# Patient Record
Sex: Male | Born: 1950 | Race: White | Hispanic: No | Marital: Married | Smoking: Never smoker
Health system: Southern US, Community
[De-identification: ages and names within clinical notes are randomized; demographics above are authoritative.]

## PROBLEM LIST (undated history)

## (undated) DIAGNOSIS — E119 Type 2 diabetes mellitus without complications: Secondary | ICD-10-CM

## (undated) HISTORY — PX: APPENDECTOMY: SHX54

---

## 2005-03-18 ENCOUNTER — Emergency Department (HOSPITAL_COMMUNITY): Admission: EM | Admit: 2005-03-18 | Discharge: 2005-03-18 | Payer: Self-pay | Admitting: *Deleted

## 2014-07-23 ENCOUNTER — Emergency Department (HOSPITAL_COMMUNITY): Payer: Medicaid Other

## 2014-07-23 ENCOUNTER — Emergency Department (HOSPITAL_COMMUNITY)
Admission: EM | Admit: 2014-07-23 | Discharge: 2014-07-23 | Disposition: A | Discharge status: Home / Self Care | Payer: Medicaid Other | Attending: Emergency Medicine | Admitting: Emergency Medicine

## 2014-07-23 ENCOUNTER — Encounter (HOSPITAL_COMMUNITY): Payer: Self-pay | Admitting: Emergency Medicine

## 2014-07-23 DIAGNOSIS — K8000 Calculus of gallbladder with acute cholecystitis without obstruction: Secondary | ICD-10-CM | POA: Diagnosis not present

## 2014-07-23 DIAGNOSIS — Z792 Long term (current) use of antibiotics: Secondary | ICD-10-CM | POA: Insufficient documentation

## 2014-07-23 DIAGNOSIS — E119 Type 2 diabetes mellitus without complications: Secondary | ICD-10-CM | POA: Insufficient documentation

## 2014-07-23 DIAGNOSIS — R109 Unspecified abdominal pain: Secondary | ICD-10-CM | POA: Insufficient documentation

## 2014-07-23 HISTORY — DX: Type 2 diabetes mellitus without complications: E11.9

## 2014-07-23 LAB — URINALYSIS, ROUTINE W REFLEX MICROSCOPIC
BILIRUBIN URINE: NEGATIVE
GLUCOSE, UA: NEGATIVE mg/dL
HGB URINE DIPSTICK: NEGATIVE
KETONES UR: 15 mg/dL — AB
Leukocytes, UA: NEGATIVE
Nitrite: NEGATIVE
PROTEIN: NEGATIVE mg/dL
Specific Gravity, Urine: 1.01 (ref 1.005–1.030)
Urobilinogen, UA: 0.2 mg/dL (ref 0.0–1.0)
pH: 8 (ref 5.0–8.0)

## 2014-07-23 LAB — COMPREHENSIVE METABOLIC PANEL
ALBUMIN: 4.9 g/dL (ref 3.5–5.2)
ALT: 15 U/L (ref 0–53)
ANION GAP: 18 — AB (ref 5–15)
AST: 24 U/L (ref 0–37)
Alkaline Phosphatase: 77 U/L (ref 39–117)
BILIRUBIN TOTAL: 0.9 mg/dL (ref 0.3–1.2)
BUN: 7 mg/dL (ref 6–23)
CHLORIDE: 95 meq/L — AB (ref 96–112)
CO2: 22 meq/L (ref 19–32)
CREATININE: 0.82 mg/dL (ref 0.50–1.35)
Calcium: 10 mg/dL (ref 8.4–10.5)
GFR calc Af Amer: >90 mL/min (ref >90)
GFR calc non Af Amer: >90 mL/min (ref >90)
Glucose, Bld: 145 mg/dL — ABNORMAL HIGH (ref 70–99)
POTASSIUM: 4.2 meq/L (ref 3.7–5.3)
SODIUM: 135 meq/L — AB (ref 137–147)
TOTAL PROTEIN: 8.5 g/dL — AB (ref 6.0–8.3)

## 2014-07-23 LAB — CBC WITH DIFFERENTIAL/PLATELET
BASOS PCT: 0 % (ref 0–1)
Basophils Absolute: 0 10*3/uL (ref 0.0–0.1)
EOS ABS: 0 10*3/uL (ref 0.0–0.7)
Eosinophils Relative: 0 % (ref 0–5)
HEMATOCRIT: 42.7 % (ref 39.0–52.0)
HEMOGLOBIN: 15.7 g/dL (ref 13.0–17.0)
LYMPHS ABS: 0.4 10*3/uL — AB (ref 0.7–4.0)
LYMPHS PCT: 4 % — AB (ref 12–46)
MCH: 32.2 pg (ref 26.0–34.0)
MCHC: 36.8 g/dL — ABNORMAL HIGH (ref 30.0–36.0)
MCV: 87.5 fL (ref 78.0–100.0)
MONO ABS: 0.5 10*3/uL (ref 0.1–1.0)
Monocytes Relative: 4 % (ref 3–12)
NEUTROS PCT: 93 % — AB (ref 43–77)
Neutro Abs: 11.3 10*3/uL — ABNORMAL HIGH (ref 1.7–7.7)
PLATELETS: 186 10*3/uL (ref 150–400)
RBC: 4.88 MIL/uL (ref 4.22–5.81)
RDW: 12.1 % (ref 11.5–15.5)
WBC: 12.2 10*3/uL — AB (ref 4.0–10.5)

## 2014-07-23 LAB — CBG MONITORING, ED: Glucose-Capillary: 140 mg/dL — ABNORMAL HIGH (ref 70–99)

## 2014-07-23 LAB — LIPASE, BLOOD: Lipase: 33 U/L (ref 11–59)

## 2014-07-23 MED ORDER — HYDROMORPHONE HCL PF 1 MG/ML IJ SOLN
0.5000 mg | Freq: Once | INTRAMUSCULAR | Status: AC
  Administered 2014-07-23: 0.5 mg via INTRAVENOUS
  Filled 2014-07-23: qty 1

## 2014-07-23 MED ORDER — PROMETHAZINE HCL 25 MG/ML IJ SOLN
12.5000 mg | Freq: Once | INTRAMUSCULAR | Status: AC
  Administered 2014-07-23: 12.5 mg via INTRAVENOUS
  Filled 2014-07-23: qty 1

## 2014-07-23 MED ORDER — ONDANSETRON HCL 4 MG/2ML IJ SOLN
INTRAMUSCULAR | Status: AC
  Administered 2014-07-23: 4 mg via INTRAVENOUS
  Filled 2014-07-23: qty 2

## 2014-07-23 MED ORDER — CIPROFLOXACIN HCL 500 MG PO TABS: 500.0000 mg | ORAL_TABLET | Freq: Two times a day (BID) | ORAL | Status: AC

## 2014-07-23 MED ORDER — CIPROFLOXACIN HCL 250 MG PO TABS
500.0000 mg | ORAL_TABLET | Freq: Once | ORAL | Status: AC
  Administered 2014-07-23: 500 mg via ORAL
  Filled 2014-07-23: qty 2

## 2014-07-23 MED ORDER — PROMETHAZINE HCL 25 MG PO TABS: 25.0000 mg | ORAL_TABLET | Freq: Four times a day (QID) | ORAL | Status: AC | PRN

## 2014-07-23 MED ORDER — OXYCODONE-ACETAMINOPHEN 5-325 MG PO TABS: 1.0000 | ORAL_TABLET | ORAL | Status: AC | PRN

## 2014-07-23 MED ORDER — ONDANSETRON HCL 4 MG/2ML IJ SOLN
4.0000 mg | Freq: Once | INTRAMUSCULAR | Status: AC
  Administered 2014-07-23: 4 mg via INTRAVENOUS

## 2014-07-23 MED ORDER — HYDROMORPHONE HCL PF 1 MG/ML IJ SOLN
INTRAMUSCULAR | Status: AC
  Administered 2014-07-23: 0.5 mg
  Filled 2014-07-23: qty 1

## 2014-07-23 MED ORDER — HYDROMORPHONE HCL PF 1 MG/ML IJ SOLN
1.0000 mg | Freq: Once | INTRAMUSCULAR | Status: AC
  Administered 2014-07-23: 1 mg via INTRAVENOUS
  Filled 2014-07-23: qty 1

## 2014-07-23 NOTE — ED Provider Notes (Signed)
CSN: [id number]     Arrival date & time 07/23/14  1038 History   First MD Initiated Contact with Patient 07/23/14 1055     Chief Complaint  Patient presents with  . Abdominal Pain     (Consider location/radiation/quality/duration/timing/severity/associated sxs/prior Treatment) HPI  Netta CorriganClaudio J Dohn is a 63 y.o. male presenting with sudden onset of very sharp right upper quadrant abdominal pain around 7 AM this morning while he was walking his dog.  He describes a very healthy eating habits, has been very strict about eating only fruits and vegetables with no carbohydrates as he is actively trying to lose weight.  Last night he indulged in potato chips and beer which he believes is the source of today's pain.  He has nausea without vomiting, denies diarrhea or constipation.  He had a normal stool this morning prior to onset of symptoms.  He does endorse a distant past history of EtOH abuse and has had hepatitis in the past, denies history of pancreatitis.  He also has diet controlled diabetes, but is not currently being followed for this as he currently has no primary care physician.  He is taking no medications prior to arrival and has had no by mouth intake today.  He denies chest pain and shortness of breath.  His pain is constant, sharp and does not radiate.  Past Medical History  Diagnosis Date  . Diabetes mellitus without complication    History reviewed. No pertinent past surgical history. History reviewed. No pertinent family history. History  Substance Use Topics  . Smoking status: Never Smoker   . Smokeless tobacco: Not on file  . Alcohol Use: Not on file    Review of Systems  Constitutional: Negative for fever.  HENT: Negative for congestion and sore throat.   Eyes: Negative.   Respiratory: Negative for chest tightness and shortness of breath.   Cardiovascular: Negative for chest pain.  Gastrointestinal: Positive for nausea and abdominal pain. Negative for vomiting.   Genitourinary: Negative.  Negative for dysuria and flank pain.  Musculoskeletal: Negative for arthralgias, joint swelling and neck pain.  Skin: Negative.  Negative for rash and wound.  Neurological: Negative for dizziness, weakness, light-headedness, numbness and headaches.  Psychiatric/Behavioral: Negative.       Allergies  Review of patient's allergies indicates no known allergies.  Home Medications   Prior to Admission medications   Medication Sig Start Date End Date Taking? Authorizing Provider  ciprofloxacin (CIPRO) 500 MG tablet Take 1 tablet (500 mg total) by mouth 2 (two) times daily. 07/23/14   Burgess AmorJulie Georganne Siple, PA-C  oxyCODONE-acetaminophen (PERCOCET/ROXICET) 5-325 MG per tablet Take 1 tablet by mouth every 4 (four) hours as needed. 07/23/14   Burgess AmorJulie Siara Gorder, PA-C  promethazine (PHENERGAN) 25 MG tablet Take 1 tablet (25 mg total) by mouth every 6 (six) hours as needed for nausea or vomiting. 07/23/14   Burgess AmorJulie Miron Marxen, PA-C   BP 164/97  Pulse 91  Temp(Src) 98.2 F (36.8 C) (Oral)  Resp 16  SpO2 100% Physical Exam  Nursing note and vitals reviewed. Constitutional: He appears well-developed and well-nourished.  HENT:  Head: Normocephalic and atraumatic.  Eyes: Conjunctivae are normal.  Neck: Normal range of motion.  Cardiovascular: Normal rate, regular rhythm, normal heart sounds and intact distal pulses.   Pulmonary/Chest: Effort normal and breath sounds normal. He has no wheezes.  Abdominal: Soft. Bowel sounds are normal. There is no hepatosplenomegaly. There is tenderness in the right upper quadrant. There is positive Murphy's sign. There is no  rigidity and no guarding.  Musculoskeletal: Normal range of motion.  Neurological: He is alert.  Skin: Skin is warm and dry.  Psychiatric: He has a normal mood and affect.    ED Course  Procedures (including critical care time) Labs Review Labs Reviewed  CBC WITH DIFFERENTIAL - Abnormal; Notable for the following:    WBC 12.2 (*)     MCHC 36.8 (*)    Neutrophils Relative % 93 (*)    Neutro Abs 11.3 (*)    Lymphocytes Relative 4 (*)    Lymphs Abs 0.4 (*)    All other components within normal limits  COMPREHENSIVE METABOLIC PANEL - Abnormal; Notable for the following:    Sodium 135 (*)    Chloride 95 (*)    Glucose, Bld 145 (*)    Total Protein 8.5 (*)    Anion gap 18 (*)    All other components within normal limits  URINALYSIS, ROUTINE W REFLEX MICROSCOPIC - Abnormal; Notable for the following:    Ketones, ur 15 (*)    All other components within normal limits  CBG MONITORING, ED - Abnormal; Notable for the following:    Glucose-Capillary 140 (*)    All other components within normal limits  LIPASE, BLOOD    Imaging Review US Abdomen Limited  07/23/2014   CLINICAL DATA:  Right upper quadrant abdominal pain with known gallstones  EXAM: US ABDOMEN LIMITED - RIGHT UPPER QUADRANT  COMPARISON:  None.  FINDINGS: Gallbladder:  The gallbladder is adequately distended. The bile is echogenic. There are non-mobile stones near the gallbladder neck. The gallbladder wall is thickened measuring 9 mm. There is no positive sonographic Murphy's sign.  Common bile duct:  Diameter: 4.3 mm  Liver:  The liver exhibits no focal mass or ductal dilation.  IMPRESSION: There are non mobile gallstones as well as sludge and gallbladder wall thickening. There is no positive sonographic Murphy's sign. The findings may reflect chronic cholecystitis.   Electronically Signed   By: David  [place]   On: 07/23/2014 13:21     EKG Interpretation None      MDM   Final diagnoses:  Calculus of gallbladder with acute cholecystitis without obstruction    Patients labs and/or radiological studies were viewed and considered during the medical decision making and disposition process. Patient with gallbladder disease, slightly elevated white blood cell count with a left shift.  He has received IV fluids along with Dilaudid and Zofran with improvement in  symptoms.  Discussed the patient's findings with Dr. Lovell Sheehan who has recommended close outpatient followup.  Patient will call for an office visit tomorrow if he has any residual symptoms, otherwise will plan to see Dr. Lovell Sheehan early next week if he remains symptom free.  In the interim Dr. Lovell Sheehan has asked for him to be placed on Cipro which was prescribed for him, his first dose was given here prior to discharge home.  Patient understands and agrees with plan today.  He was given strict return precautions including worsens pain, uncontrolled vomiting or development of fevers.  He was advised to avoid fatty foods.    Burgess Amor, PA-C 07/23/14 1811

## 2014-07-23 NOTE — ED Notes (Signed)
Pt has stopped moaning since dilaudid given. States better but still hurts. Pt making jokes at times but groggy.

## 2014-07-23 NOTE — Discharge Instructions (Signed)
Cholecystitis Cholecystitis is an inflammation of your gallbladder. It is usually caused by a buildup of gallstones or sludge (cholelithiasis) in your gallbladder. The gallbladder stores a fluid that helps digest fats (bile). Cholecystitis is serious and needs treatment right away.  CAUSES   Gallstones. Gallstones can block the tube that leads to your gallbladder, causing bile to build up. As bile builds up, the gallbladder becomes inflamed.  Bile duct problems, such as blockage from scarring or kinking.  Tumors. Tumors can stop bile from leaving your gallbladder correctly, causing bile to build up. As bile builds up, the gallbladder becomes inflamed. SYMPTOMS   Nausea.  Vomiting.  Abdominal pain, especially in the upper right area of your abdomen.  Abdominal tenderness or bloating.  Sweating.  Chills.  Fever.  Yellowing of the skin and the whites of the eyes (jaundice). DIAGNOSIS  Your caregiver may order blood tests to look for infection or gallbladder problems. Your caregiver may also order imaging tests, such as an ultrasound or computed tomography (CT) scan. Further tests may include a hepatobiliary iminodiacetic acid (HIDA) scan. This scan allows your caregiver to see your bile move from the liver to the gallbladder and to the small intestine. TREATMENT  A hospital stay is usually necessary to lessen the inflammation of your gallbladder. You may be required to not eat or drink (fast) for a certain amount of time. You may be given medicine to treat pain or an antibiotic medicine to treat an infection. Surgery may be needed to remove your gallbladder (cholecystectomy) once the inflammation has gone down. Surgery may be needed right away if you develop complications such as death of gallbladder tissue (gangrene) or a tear (perforation) of the gallbladder.  HOME CARE INSTRUCTIONS  Home care will depend on your treatment. In general:  If you were given antibiotics, take them as  directed. Finish them even if you start to feel better.  Only take over-the-counter or prescription medicines for pain, discomfort, or fever as directed by your caregiver.  Follow a low-fat diet until you see your caregiver again.  Keep all follow-up visits as directed by your caregiver. SEEK IMMEDIATE MEDICAL CARE IF:   Your pain is increasing and not controlled by medicines.  Your pain moves to another part of your abdomen or to your back.  You have a fever.  You have nausea and vomiting. MAKE SURE YOU:  Understand these instructions.  Will watch your condition.  Will get help right away if you are not doing well or get worse. Document Released: 11/28/2005 Document Revised: 02/20/2012 Document Reviewed: 10/14/2011 [facility] Patient Information 2015 [employer] [place]. This information is not intended to replace advice given to you by your health care provider. Make sure you discuss any questions you have with your health care provider.   Use the medicines prescribed for pain and nausea.  Use caution as they will make you sleepy.  Do not drive within 4 hours of taking percocet or phenergan.  Take you next dose of the antibiotic tonight (ciprofloxacin). Avoid eating any fatty foods as this can trigger a gallbladder attack.  Call Dr. Lovell SheehanJenkins for an office visit tomorrow (call before 9:30) if you are still having symptoms.  If your symptoms are gone,  You can wait until early next week to see him.  Get rechecked immediately if you develop worsened pain, uncontrolled vomiting or FEVER.

## 2014-07-23 NOTE — ED Notes (Signed)
Pt constantly moaning, grimacing and slightly restless. Advised edp will be in asap. Pt in fetal position. Alert/oriented. Mm moist to dry. Mild dehydration noted. Fluids NS started to heplock at this time at 7450ml/hr.

## 2014-07-23 NOTE — ED Notes (Signed)
Pain c/o of sharp RUQ pain since 0700 this am worse with ambulation. Pt states he normally eats a carb free diet, however indulged in carbs last night, Hx of DM. Dry heaves.

## 2014-07-23 NOTE — ED Notes (Signed)
Pt states his pain is a 12/10 and going up. Pt medicated

## 2014-07-24 NOTE — ED Provider Notes (Signed)
Medical screening examination/treatment/procedure(s) were performed by non-physician practitioner and as supervising physician I was immediately available for consultation/collaboration.   EKG Interpretation None        Benny LennertJoseph L Traves Majchrzak, MD 07/24/14 [phone]

## 2018-02-19 ENCOUNTER — Emergency Department (HOSPITAL_COMMUNITY)
Admission: EM | Admit: 2018-02-19 | Discharge: 2018-02-19 | Disposition: A | Discharge status: Home / Self Care | Payer: Medicare Other | Attending: Emergency Medicine | Admitting: Emergency Medicine

## 2018-02-19 ENCOUNTER — Emergency Department (HOSPITAL_COMMUNITY): Payer: Medicare Other

## 2018-02-19 ENCOUNTER — Encounter (HOSPITAL_COMMUNITY): Payer: Self-pay

## 2018-02-19 DIAGNOSIS — K8020 Calculus of gallbladder without cholecystitis without obstruction: Secondary | ICD-10-CM | POA: Insufficient documentation

## 2018-02-19 DIAGNOSIS — K2980 Duodenitis without bleeding: Secondary | ICD-10-CM | POA: Insufficient documentation

## 2018-02-19 DIAGNOSIS — R1013 Epigastric pain: Secondary | ICD-10-CM

## 2018-02-19 LAB — CBC WITH DIFFERENTIAL/PLATELET
Basophils Absolute: 0 10*3/uL (ref 0.0–0.1)
Basophils Relative: 0 %
EOS ABS: 0 10*3/uL (ref 0.0–0.7)
Eosinophils Relative: 0 %
HCT: 46.2 % (ref 39.0–52.0)
HEMOGLOBIN: 15.9 g/dL (ref 13.0–17.0)
LYMPHS ABS: 1.8 10*3/uL (ref 0.7–4.0)
Lymphocytes Relative: 19 %
MCH: 31.2 pg (ref 26.0–34.0)
MCHC: 34.4 g/dL (ref 30.0–36.0)
MCV: 90.6 fL (ref 78.0–100.0)
MONOS PCT: 9 %
Monocytes Absolute: 0.8 10*3/uL (ref 0.1–1.0)
NEUTROS PCT: 72 %
Neutro Abs: 6.6 10*3/uL (ref 1.7–7.7)
Platelets: 267 10*3/uL (ref 150–400)
RBC: 5.1 MIL/uL (ref 4.22–5.81)
RDW: 12.8 % (ref 11.5–15.5)
WBC: 9.2 10*3/uL (ref 4.0–10.5)

## 2018-02-19 LAB — URINALYSIS, ROUTINE W REFLEX MICROSCOPIC
Bilirubin Urine: NEGATIVE
GLUCOSE, UA: NEGATIVE mg/dL
Hgb urine dipstick: NEGATIVE
KETONES UR: 20 mg/dL — AB
LEUKOCYTES UA: NEGATIVE
Nitrite: NEGATIVE
PROTEIN: NEGATIVE mg/dL
Specific Gravity, Urine: 1.018 (ref 1.005–1.030)
pH: 6 (ref 5.0–8.0)

## 2018-02-19 LAB — COMPREHENSIVE METABOLIC PANEL
ALK PHOS: 233 U/L — AB (ref 38–126)
ALT: 56 U/L (ref 17–63)
ANION GAP: 15 (ref 5–15)
AST: 38 U/L (ref 15–41)
Albumin: 4.3 g/dL (ref 3.5–5.0)
BUN: 11 mg/dL (ref 6–20)
CALCIUM: 10.2 mg/dL (ref 8.9–10.3)
CO2: 22 mmol/L (ref 22–32)
Chloride: 99 mmol/L — ABNORMAL LOW (ref 101–111)
Creatinine, Ser: 0.85 mg/dL (ref 0.61–1.24)
Glucose, Bld: 137 mg/dL — ABNORMAL HIGH (ref 65–99)
Potassium: 4.1 mmol/L (ref 3.5–5.1)
Sodium: 136 mmol/L (ref 135–145)
Total Bilirubin: 1.2 mg/dL (ref 0.3–1.2)
Total Protein: 8.3 g/dL — ABNORMAL HIGH (ref 6.5–8.1)

## 2018-02-19 LAB — LIPASE, BLOOD: Lipase: 39 U/L (ref 11–51)

## 2018-02-19 LAB — TROPONIN I: Troponin I: <0.03 ng/mL (ref <0.03)

## 2018-02-19 MED ORDER — PANTOPRAZOLE SODIUM 20 MG PO TBEC: 20.0000 mg | DELAYED_RELEASE_TABLET | Freq: Every day | ORAL | 0 refills | Status: AC

## 2018-02-19 MED ORDER — ONDANSETRON HCL 4 MG PO TABS: 4.0000 mg | ORAL_TABLET | Freq: Four times a day (QID) | ORAL | 0 refills | Status: AC | PRN

## 2018-02-19 MED ORDER — SODIUM CHLORIDE 0.9 % IV BOLUS (SEPSIS)
500.0000 mL | Freq: Once | INTRAVENOUS | Status: AC
  Administered 2018-02-19: 500 mL via INTRAVENOUS

## 2018-02-19 MED ORDER — KETOROLAC TROMETHAMINE 30 MG/ML IJ SOLN
30.0000 mg | Freq: Once | INTRAMUSCULAR | Status: AC
  Administered 2018-02-19: 30 mg via INTRAVENOUS
  Filled 2018-02-19: qty 1

## 2018-02-19 MED ORDER — HYDROCODONE-ACETAMINOPHEN 5-325 MG PO TABS: 1.0000 | ORAL_TABLET | ORAL | 0 refills | Status: AC | PRN

## 2018-02-19 MED ORDER — MORPHINE SULFATE (PF) 4 MG/ML IV SOLN
4.0000 mg | Freq: Once | INTRAVENOUS | Status: AC
  Administered 2018-02-19: 4 mg via INTRAVENOUS
  Filled 2018-02-19: qty 1

## 2018-02-19 MED ORDER — ONDANSETRON HCL 4 MG/2ML IJ SOLN
4.0000 mg | Freq: Once | INTRAMUSCULAR | Status: AC
  Administered 2018-02-19: 4 mg via INTRAVENOUS
  Filled 2018-02-19: qty 2

## 2018-02-19 MED ORDER — GI COCKTAIL ~~LOC~~
30.0000 mL | Freq: Once | ORAL | Status: AC
  Administered 2018-02-19: 30 mL via ORAL
  Filled 2018-02-19: qty 30

## 2018-02-19 NOTE — ED Triage Notes (Signed)
Pt reports chest pain, abd pain, fever, and vomiting since Friday.  Reports had similar symptoms 4 years and it was from his gall bladder.

## 2018-02-19 NOTE — ED Provider Notes (Signed)
[facility] EMERGENCY DEPARTMENT Provider Note   CSN: [id number] Arrival date & time: 02/19/18  4098     History   Chief Complaint Chief Complaint  Patient presents with  . Abdominal Pain  . Fever    HPI Roger Lyons is a 67 y.o. male.  HPI Patient presents with acute onset left upper quadrant abdominal pain radiating to the left flank starting 2 days ago.  Associated with nausea and "dry heaving".  He has had subjective fevers and chills.  States he does not had a bowel movement in 2 days.  Denies dysuria or gross hematuria but states his urine has been dark. Past Medical History:  Diagnosis Date  . Diabetes mellitus without complication (HCC)     There are no active problems to display for this patient.   Past Surgical History:  Procedure Laterality Date  . APPENDECTOMY         Home Medications    Prior to Admission medications   Medication Sig Start Date End Date Taking? Authorizing Provider  ciprofloxacin (CIPRO) 500 MG tablet Take 1 tablet (500 mg total) by mouth 2 (two) times daily. Patient not taking: Reported on 02/19/2018 07/23/14   Burgess Amor, PA-C  HYDROcodone-acetaminophen (NORCO) 5-325 MG tablet Take 1 tablet by mouth every 4 (four) hours as needed for severe pain. 02/19/18   Loren Racer, MD  ondansetron (ZOFRAN) 4 MG tablet Take 1 tablet (4 mg total) by mouth every 6 (six) hours as needed for nausea or vomiting. 02/19/18   Loren Racer, MD  oxyCODONE-acetaminophen (PERCOCET/ROXICET) 5-325 MG per tablet Take 1 tablet by mouth every 4 (four) hours as needed. Patient not taking: Reported on 02/19/2018 07/23/14   Burgess Amor, PA-C  pantoprazole (PROTONIX) 20 MG tablet Take 1 tablet (20 mg total) by mouth daily. 02/19/18   Loren Racer, MD  promethazine (PHENERGAN) 25 MG tablet Take 1 tablet (25 mg total) by mouth every 6 (six) hours as needed for nausea or vomiting. Patient not taking: Reported on 02/19/2018 07/23/14   Burgess Amor, PA-C     Family History No family history on file.  Social History Social History   Tobacco Use  . Smoking status: Never Smoker  . Smokeless tobacco: Never Used  Substance Use Topics  . Alcohol use: No    Frequency: Never  . Drug use: No     Allergies   Patient has no known allergies.   Review of Systems Review of Systems  Constitutional: Negative for chills and fever.  HENT: Negative for sore throat and trouble swallowing.   Respiratory: Negative for cough and shortness of breath.   Cardiovascular: Negative for chest pain.  Gastrointestinal: Positive for abdominal pain, constipation, nausea and vomiting. Negative for blood in stool.  Genitourinary: Positive for flank pain. Negative for dysuria, frequency and hematuria.  Musculoskeletal: Positive for back pain. Negative for neck pain.  Skin: Negative for rash and wound.  Neurological: Negative for dizziness, syncope, weakness, numbness and headaches.  All other systems reviewed and are negative.    Physical Exam Updated Vital Signs BP (!) 141/87   Pulse 60   Temp 98.5 F (36.9 C) (Oral)   Resp 20   Ht 5\' 7"  (1.702 m)   Wt 65.8 kg (145 lb)   SpO2 92%   BMI 22.71 kg/m   Physical Exam  Constitutional: He is oriented to person, place, and time. He appears well-developed and well-nourished. He appears distressed.  HENT:  Head: Normocephalic and atraumatic.  Mouth/Throat: Oropharynx  is clear and moist. No oropharyngeal exudate.  Eyes: EOM are normal. Pupils are equal, round, and reactive to light.  Neck: Normal range of motion. Neck supple.  Cardiovascular: Normal rate and regular rhythm. Exam reveals no gallop and no friction rub.  No murmur heard. Pulmonary/Chest: Effort normal and breath sounds normal. No stridor. No respiratory distress. He has no wheezes. He has no rales. He exhibits no tenderness.  Abdominal: Soft. Bowel sounds are normal. He exhibits no distension. There is no tenderness. There is no rebound  and no guarding.  Benign abdominal exam.  No tenderness with palpation.  Musculoskeletal: Normal range of motion. He exhibits no edema or tenderness.  Left CVA tenderness with percussion.  No midline thoracic or lumbar tenderness.  No lower extremity swelling, asymmetry or tenderness.  Neurological: He is alert and oriented to person, place, and time.  Moves all extremities without focal deficit.  Sensation fully intact.  Skin: Skin is warm and dry. Capillary refill takes less than 2 seconds. No rash noted. No erythema.  Psychiatric: He has a normal mood and affect. His behavior is normal.  Nursing note and vitals reviewed.    ED Treatments / Results  Labs (all labs ordered are listed, but only abnormal results are displayed) Labs Reviewed  COMPREHENSIVE METABOLIC PANEL - Abnormal; Notable for the following components:      Result Value   Chloride 99 (*)    Glucose, Bld 137 (*)    Total Protein 8.3 (*)    Alkaline Phosphatase 233 (*)    All other components within normal limits  URINALYSIS, ROUTINE W REFLEX MICROSCOPIC - Abnormal; Notable for the following components:   APPearance HAZY (*)    Ketones, ur 20 (*)    All other components within normal limits  CBC WITH DIFFERENTIAL/PLATELET  LIPASE, BLOOD  TROPONIN I    EKG  EKG Interpretation  Date/Time:  Monday February 19 2018 09:39:55 EDT Ventricular Rate:  71 PR Interval:    QRS Duration: 95 QT Interval:  393 QTC Calculation: 433 R Axis:   74 Text Interpretation:  Sinus rhythm Baseline wander in lead(s) V4 Confirmed by Loren Racer ([id number]) on 02/19/2018 9:56:26 AM       Radiology Ct Renal Stone Study  Result Date: 02/19/2018 CLINICAL DATA:  Upper abdominal pain for 4 days EXAM: CT ABDOMEN AND PELVIS WITHOUT CONTRAST TECHNIQUE: Multidetector CT imaging of the abdomen and pelvis was performed following the standard protocol without IV contrast. COMPARISON:  03/18/2005 FINDINGS: Lower chest: Lung bases are clear. No  effusions. Heart is normal size. Hepatobiliary: Gallstone noted within a contracted gallbladder measures up to 2.4 cm. Mild intrahepatic and extrahepatic biliary ductal dilatation. Common bile duct measures up to 8 mm. No visible stones in the common bile duct. No focal hepatic abnormality. Pancreas: No focal abnormality or ductal dilatation. Spleen: No focal abnormality.  Normal size. Adrenals/Urinary Tract: Small exophytic nodule off the upper pole of the right kidney measures 1.5 cm in cannot be fully characterized without intravenous contrast. This was present on prior study and measured 10 mm. Therefore, I favor this represents a benign cyst. New low-density lesion off the lower pole of the right kidney measures up to 2.4 cm. Exophytic low-density lesion off the midpole of the left kidney measures 10 mm. No hydronephrosis. No adrenal mass. Urinary bladder unremarkable. Stomach/Bowel: Left colonic diverticulosis and right colonic diverticulosis. No active diverticulitis. No evidence of bowel obstruction. There is some indistinctness and possible wall thickening within the 1st  and 2nd portions of the duodenum. Surrounding haziness, appears more centered around the duodenum than the pancreatic head. Findings may reflect duodenitis. Vascular/Lymphatic: Aortic atherosclerosis. No enlarged abdominal or pelvic lymph nodes. Reproductive: Mild prostate prominence. Other: Trace free fluid in the pelvis.  No free air. Musculoskeletal: No acute bony abnormality. IMPRESSION: Cholelithiasis. Slight intrahepatic and extrahepatic biliary ductal dilatation without visible stones. There is apparent mild wall thickening in the 1st and 2nd portions of the duodenum with surrounding stranding raising the possibility for duodenitis. Bilateral low-density lesions in the kidneys which cannot be fully characterized without intravenous contrast. These could be fully characterized with contrast-enhanced CT or ultrasound. Colonic  diverticulosis.  No active diverticulitis. Electronically Signed   By: Charlett NoseKevin  Dover M.D.   On: 02/19/2018 10:40    Procedures Procedures (including critical care time)  Medications Ordered in ED Medications  morphine 4 MG/ML injection 4 mg (4 mg Intravenous Given 02/19/18 0951)  ketorolac (TORADOL) 30 MG/ML injection 30 mg (30 mg Intravenous Given 02/19/18 0951)  sodium chloride 0.9 % bolus 500 mL (0 mLs Intravenous Stopped 02/19/18 1121)  ondansetron (ZOFRAN) injection 4 mg (4 mg Intravenous Given 02/19/18 0951)  morphine 4 MG/ML injection 4 mg (4 mg Intravenous Given 02/19/18 1315)  gi cocktail (Maalox,Lidocaine,Donnatal) (30 mLs Oral Given 02/19/18 1315)     Initial Impression / Assessment and Plan / ED Course  I have reviewed the triage vital signs and the nursing notes.  Pertinent labs & imaging results that were available during my care of the patient were reviewed by me and considered in my medical decision making (see chart for details).     Repeat abdominal exam is benign. pain is controlled.  CT with likely duodenitis.  Patient does have evidence of cholelithiasis.  Low suspicion for cholecystitis.  Will start on PPI, pain medication, nausea medication have follow-up with gastroenterology.  Return precautions have been given.  Final Clinical Impressions(s) / ED Diagnoses   Final diagnoses:  Epigastric pain  Duodenitis  Calculus of gallbladder without cholecystitis without obstruction    ED Discharge Orders        Ordered    pantoprazole (PROTONIX) 20 MG tablet  Daily     02/19/18 1342    ondansetron (ZOFRAN) 4 MG tablet  Every 6 hours PRN     02/19/18 1342    HYDROcodone-acetaminophen (NORCO) 5-325 MG tablet  Every 4 hours PRN     02/19/18 1342       Loren RacerYelverton, Janeya Deyo, MD 02/19/18 1344

## 2018-02-19 NOTE — ED Notes (Signed)
Pt unable to urinate at this time.  States his pain is starting to come back.

## 2019-04-24 IMAGING — CT CT RENAL STONE PROTOCOL
2 of 4 series · 15 of 46 positions shown, 17 images · non-contrast
Comparison: 03/18/2005

CLINICAL DATA: Upper abdominal pain for 4 days

EXAM:
CT ABDOMEN AND PELVIS WITHOUT CONTRAST
TECHNIQUE: Multidetector CT imaging of the abdomen and pelvis was performed
following the standard protocol without IV contrast.

[Series 2: axial st · axial · 0.72mm/px · z∈[+1504,+1929]mm · 12 of 93 slices shown, 14 images]
[im 4/93  soft-tissue]
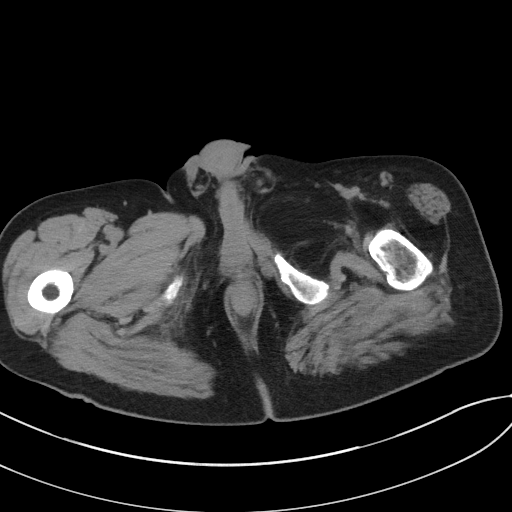
[im 4/93  bone]
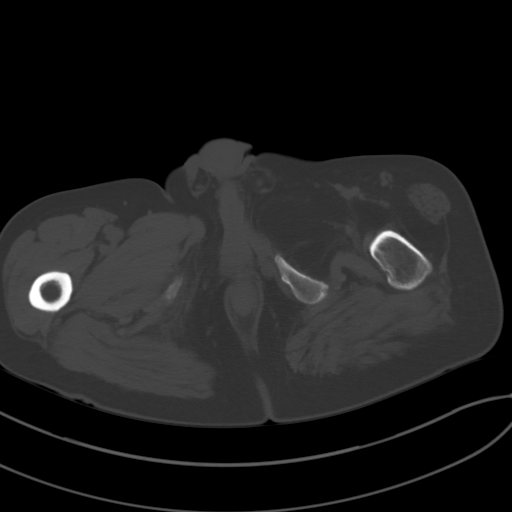
[im 12/93  soft-tissue]
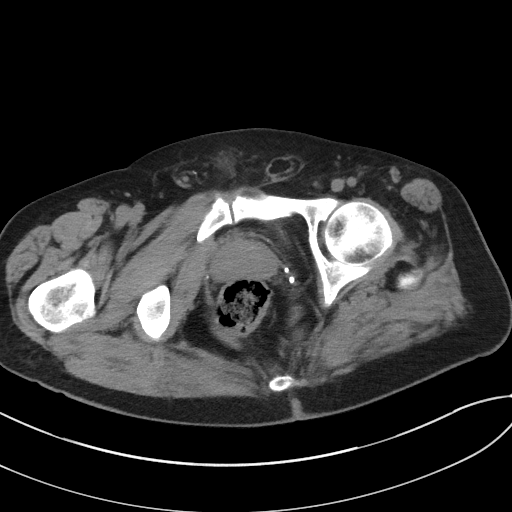
[im 20/93  soft-tissue]
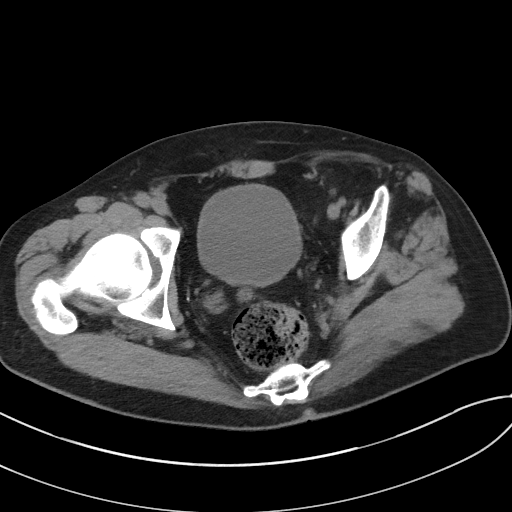
[im 27/93  soft-tissue]
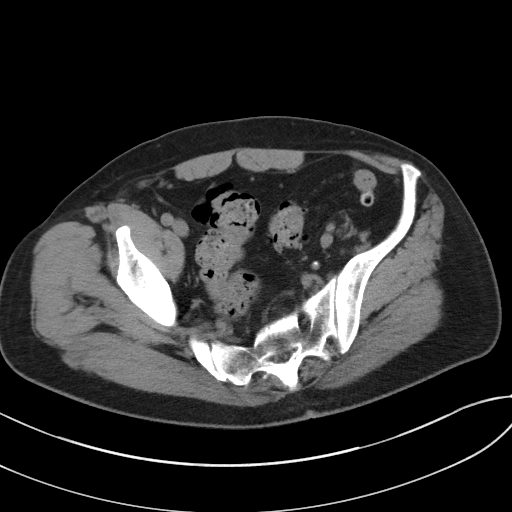
[im 35/93  soft-tissue]
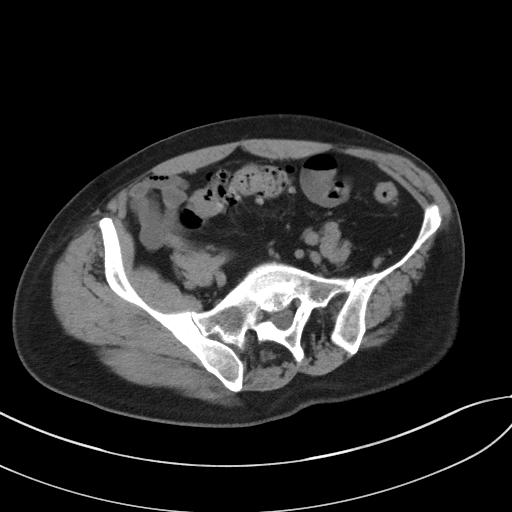
[im 43/93  soft-tissue]
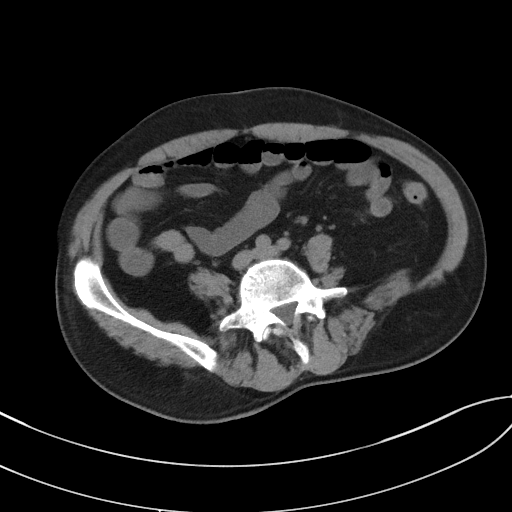
[im 50/93  soft-tissue]
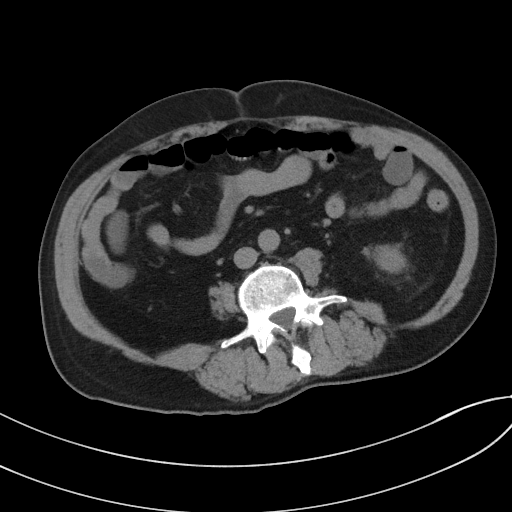
[im 58/93  soft-tissue]
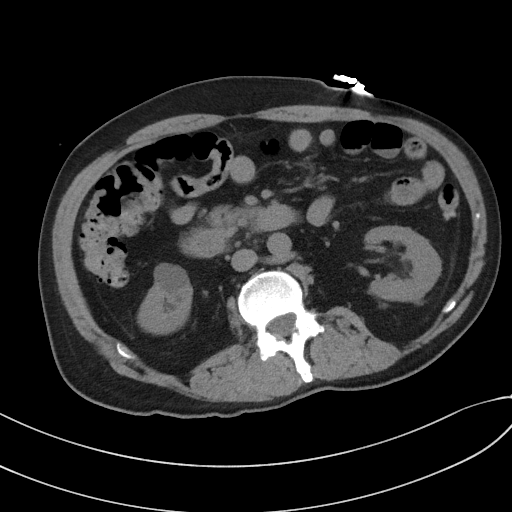
[im 66/93  soft-tissue]
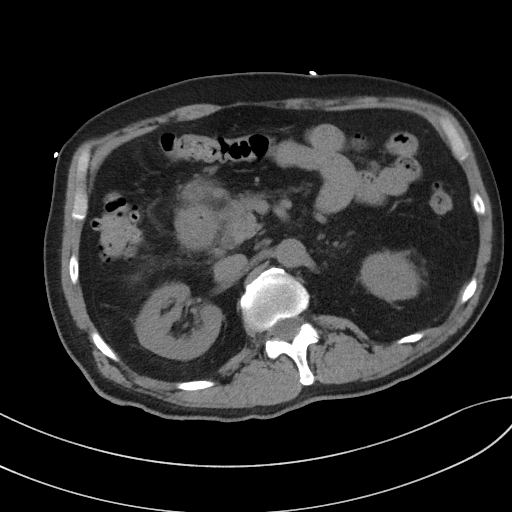
[im 66/93  bone]
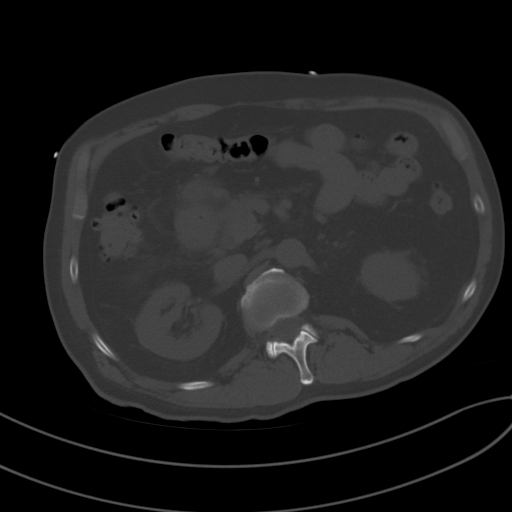
[im 73/93  soft-tissue]
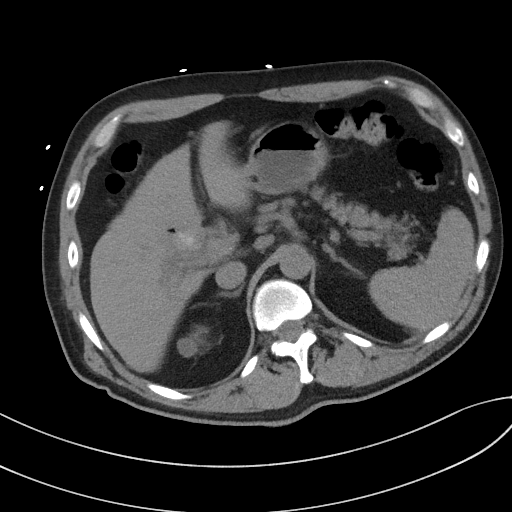
[im 81/93  soft-tissue]
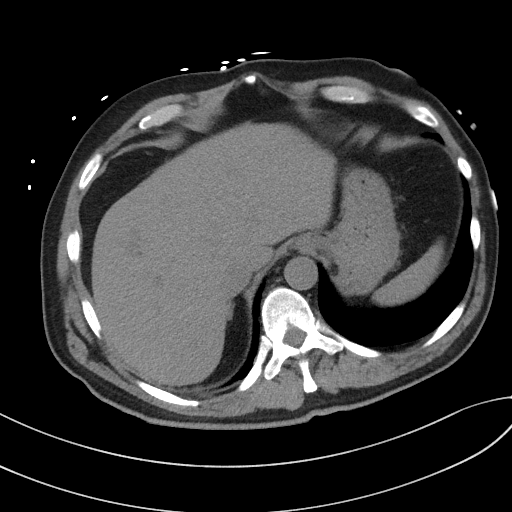
[im 89/93  soft-tissue]
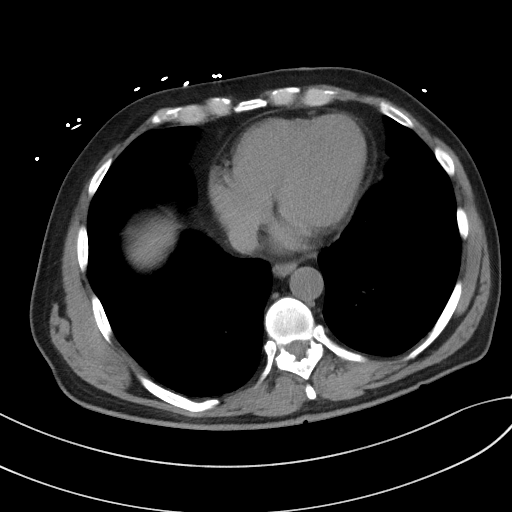

[Series 5: coronal st · coronal · 0.72mm/px · 3 of 89 slices shown]
[im 30/89  soft-tissue]
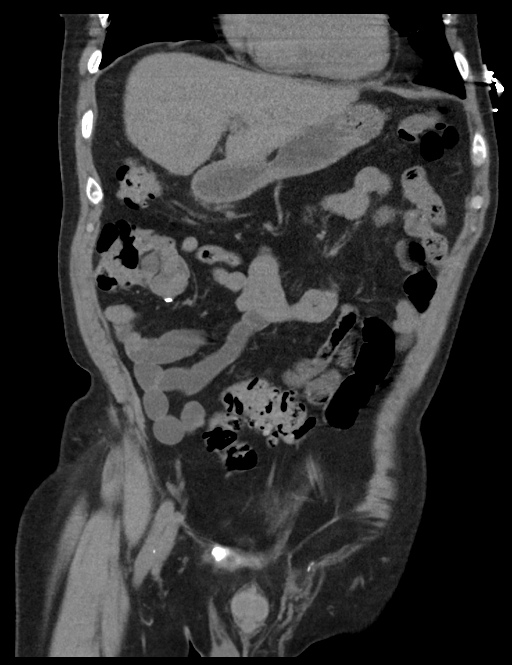
[im 40/89  soft-tissue]
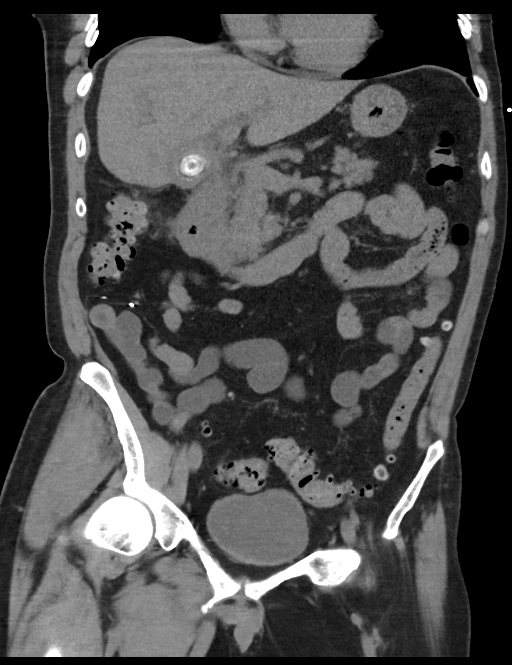
[im 49/89  soft-tissue]
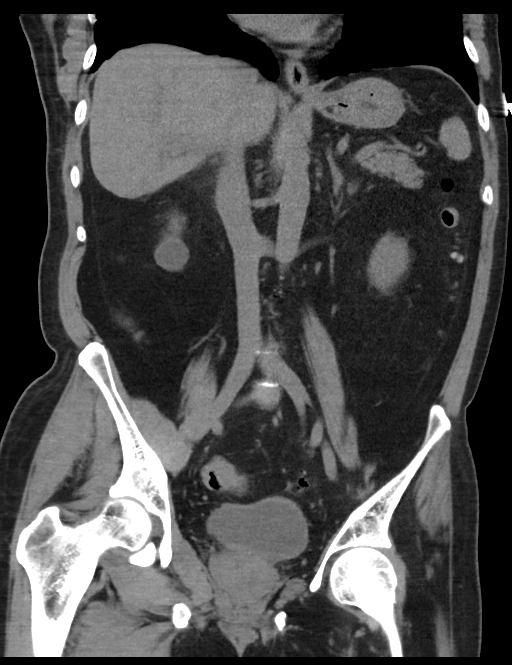

[15 of 46 positions shown; findings below may reference images not displayed]

FINDINGS: Lower chest: Lung bases are clear. No effusions. Heart is normal
size.

Hepatobiliary: Gallstone noted within a contracted gallbladder
measures up to 2.4 cm. Mild intrahepatic and extrahepatic biliary
ductal dilatation. Common bile duct measures up to 8 mm. No visible
stones in the common bile duct. No focal hepatic abnormality.

Pancreas: No focal abnormality or ductal dilatation.

Spleen: No focal abnormality.  Normal size.

Adrenals/Urinary Tract: Small exophytic nodule off the upper pole of
the right kidney measures 1.5 cm in cannot be fully characterized
without intravenous contrast. This was present on prior study and
measured 10 mm. Therefore, I favor this represents a benign cyst.
New low-density lesion off the lower pole of the right kidney
measures up to 2.4 cm. Exophytic low-density lesion off the midpole
of the left kidney measures 10 mm. No hydronephrosis. No adrenal
mass. Urinary bladder unremarkable.

Stomach/Bowel: Left colonic diverticulosis and right colonic
diverticulosis. No active diverticulitis. No evidence of bowel
obstruction. There is some indistinctness and possible wall
thickening within the 1st and 2nd portions of the duodenum.
Surrounding haziness, appears more centered around the duodenum than
the pancreatic head. Findings may reflect duodenitis.

Vascular/Lymphatic: Aortic atherosclerosis. No enlarged abdominal or
pelvic lymph nodes.

Reproductive: Mild prostate prominence.

Other: Trace free fluid in the pelvis.  No free air.

Musculoskeletal: No acute bony abnormality.
IMPRESSION: Cholelithiasis. Slight intrahepatic and extrahepatic biliary ductal
dilatation without visible stones.

There is apparent mild wall thickening in the 1st and 2nd portions
of the duodenum with surrounding stranding raising the possibility
for duodenitis.

Bilateral low-density lesions in the kidneys which cannot be fully
characterized without intravenous contrast. These could be fully
characterized with contrast-enhanced CT or ultrasound.

Colonic diverticulosis.  No active diverticulitis.
